# Patient Record
Sex: Female | Born: 1980 | Race: White | Hispanic: No | Smoking: Current every day smoker
Health system: Southern US, Community
[De-identification: ages and names within clinical notes are randomized; demographics above are authoritative.]

## PROBLEM LIST (undated history)

## (undated) DIAGNOSIS — J45909 Unspecified asthma, uncomplicated: Secondary | ICD-10-CM

## (undated) DIAGNOSIS — F329 Major depressive disorder, single episode, unspecified: Secondary | ICD-10-CM

## (undated) DIAGNOSIS — F419 Anxiety disorder, unspecified: Secondary | ICD-10-CM

## (undated) DIAGNOSIS — J40 Bronchitis, not specified as acute or chronic: Secondary | ICD-10-CM

## (undated) DIAGNOSIS — I10 Essential (primary) hypertension: Secondary | ICD-10-CM

## (undated) DIAGNOSIS — F32A Depression, unspecified: Secondary | ICD-10-CM

## (undated) DIAGNOSIS — F319 Bipolar disorder, unspecified: Secondary | ICD-10-CM

## (undated) HISTORY — PX: APPENDECTOMY: SHX54

## (undated) HISTORY — PX: WISDOM TOOTH EXTRACTION: SHX21

---
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
## 2006-08-30 ENCOUNTER — Ambulatory Visit (HOSPITAL_COMMUNITY): Admission: AD | Admit: 2006-08-30 | Discharge: 2006-08-30 | Payer: Self-pay | Admitting: Obstetrics and Gynecology

## 2006-09-06 ENCOUNTER — Inpatient Hospital Stay (HOSPITAL_COMMUNITY): Admission: AD | Admit: 2006-09-06 | Discharge: 2006-09-09 | Payer: Self-pay | Admitting: Obstetrics and Gynecology

## 2006-09-07 ENCOUNTER — Encounter (INDEPENDENT_AMBULATORY_CARE_PROVIDER_SITE_OTHER): Payer: Self-pay | Admitting: *Deleted

## 2007-03-17 ENCOUNTER — Ambulatory Visit (HOSPITAL_COMMUNITY): Admission: RE | Admit: 2007-03-17 | Discharge: 2007-03-17 | Payer: Self-pay | Admitting: Obstetrics & Gynecology

## 2007-04-07 ENCOUNTER — Other Ambulatory Visit: Admission: RE | Admit: 2007-04-07 | Discharge: 2007-04-07 | Payer: Self-pay | Admitting: Obstetrics and Gynecology

## 2007-05-24 ENCOUNTER — Ambulatory Visit (HOSPITAL_COMMUNITY): Admission: RE | Admit: 2007-05-24 | Discharge: 2007-05-24 | Payer: Self-pay | Admitting: Obstetrics & Gynecology

## 2007-08-27 ENCOUNTER — Emergency Department (HOSPITAL_COMMUNITY): Admission: EM | Admit: 2007-08-27 | Discharge: 2007-08-27 | Payer: Self-pay | Admitting: Emergency Medicine

## 2008-01-22 ENCOUNTER — Emergency Department (HOSPITAL_COMMUNITY): Admission: EM | Admit: 2008-01-22 | Discharge: 2008-01-22 | Payer: Self-pay | Admitting: Emergency Medicine

## 2008-03-23 ENCOUNTER — Emergency Department (HOSPITAL_COMMUNITY): Admission: EM | Admit: 2008-03-23 | Discharge: 2008-03-23 | Payer: Self-pay | Admitting: Emergency Medicine

## 2008-03-24 ENCOUNTER — Emergency Department (HOSPITAL_COMMUNITY): Admission: EM | Admit: 2008-03-24 | Discharge: 2008-03-24 | Payer: Self-pay | Admitting: Emergency Medicine

## 2008-06-01 ENCOUNTER — Other Ambulatory Visit: Admission: RE | Admit: 2008-06-01 | Discharge: 2008-06-01 | Payer: Self-pay | Admitting: Obstetrics and Gynecology

## 2008-07-29 ENCOUNTER — Emergency Department (HOSPITAL_COMMUNITY): Admission: EM | Admit: 2008-07-29 | Discharge: 2008-07-29 | Payer: Self-pay | Admitting: Emergency Medicine

## 2008-08-02 ENCOUNTER — Ambulatory Visit: Payer: Self-pay | Admitting: Orthopedic Surgery

## 2008-08-02 DIAGNOSIS — M79609 Pain in unspecified limb: Secondary | ICD-10-CM

## 2008-08-03 ENCOUNTER — Telehealth: Payer: Self-pay | Admitting: Orthopedic Surgery

## 2008-08-03 ENCOUNTER — Encounter: Payer: Self-pay | Admitting: Orthopedic Surgery

## 2008-08-16 ENCOUNTER — Ambulatory Visit: Payer: Self-pay | Admitting: Orthopedic Surgery

## 2008-08-30 ENCOUNTER — Ambulatory Visit: Payer: Self-pay | Admitting: Orthopedic Surgery

## 2008-09-06 ENCOUNTER — Ambulatory Visit: Payer: Self-pay | Admitting: Orthopedic Surgery

## 2008-09-27 ENCOUNTER — Ambulatory Visit: Payer: Self-pay | Admitting: Orthopedic Surgery

## 2008-11-17 ENCOUNTER — Inpatient Hospital Stay (HOSPITAL_COMMUNITY): Admission: EM | Admit: 2008-11-17 | Discharge: 2008-11-19 | Payer: Self-pay | Admitting: Emergency Medicine

## 2008-11-17 ENCOUNTER — Encounter (INDEPENDENT_AMBULATORY_CARE_PROVIDER_SITE_OTHER): Payer: Self-pay | Admitting: General Surgery

## 2008-12-07 ENCOUNTER — Emergency Department (HOSPITAL_COMMUNITY): Admission: EM | Admit: 2008-12-07 | Discharge: 2008-12-07 | Payer: Self-pay | Admitting: Emergency Medicine

## 2009-03-12 ENCOUNTER — Emergency Department (HOSPITAL_COMMUNITY): Admission: EM | Admit: 2009-03-12 | Discharge: 2009-03-12 | Payer: Self-pay | Admitting: Emergency Medicine

## 2009-03-29 ENCOUNTER — Emergency Department (HOSPITAL_COMMUNITY): Admission: EM | Admit: 2009-03-29 | Discharge: 2009-03-29 | Payer: Self-pay | Admitting: Emergency Medicine

## 2010-05-08 IMAGING — CT CT ABDOMEN W/ CM
2 of 4 series · 16 of 46 positions shown, 18 images · IV contrast (Omnipaque 300)
Comparison: None available.

CT ABDOMEN

CLINICAL DATA: Abdominal pain, nausea and vomiting.

CT ABDOMEN AND PELVIS WITH CONTRAST
TECHNIQUE: Multidetector CT imaging of the abdomen and pelvis was
performed using the standard protocol following bolus
administration of intravenous contrast.
Contrast: 100 ml 3mnipaque-RDD.

[Series 2: abd_pel_with 5.0 b40f · axial · 0.61mm/px · z∈[-439,-44]mm · 13 of 87 slices shown, 15 images]
[im 4/87  soft-tissue]
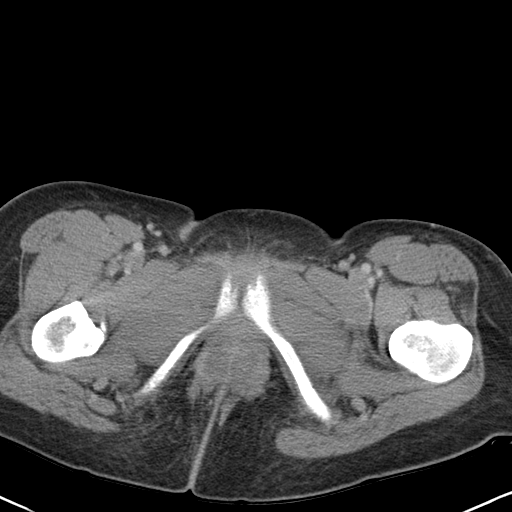
[im 4/87  bone]
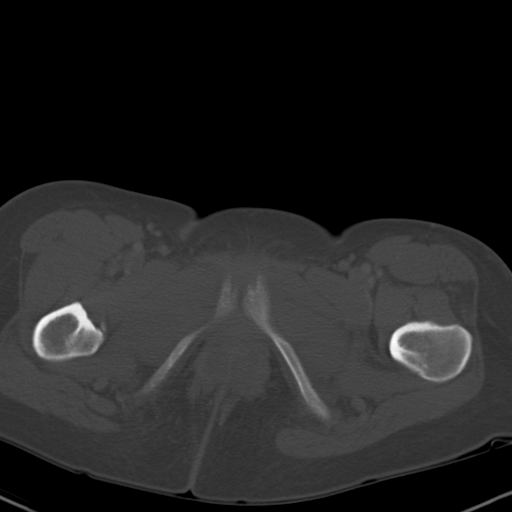
[im 11/87  soft-tissue]
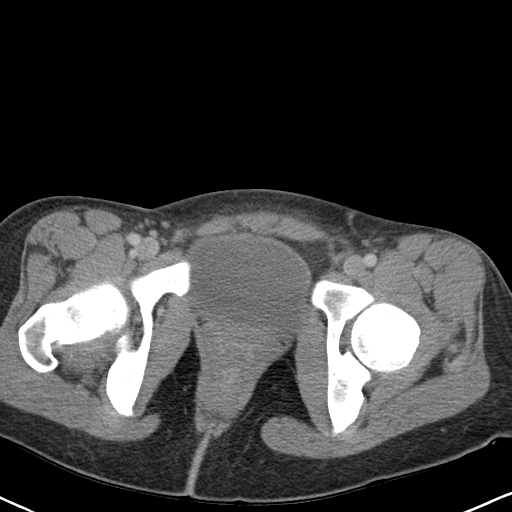
[im 18/87  soft-tissue]
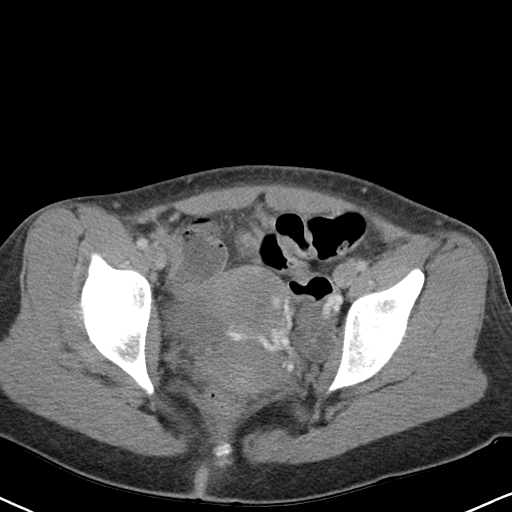
[im 25/87  soft-tissue]
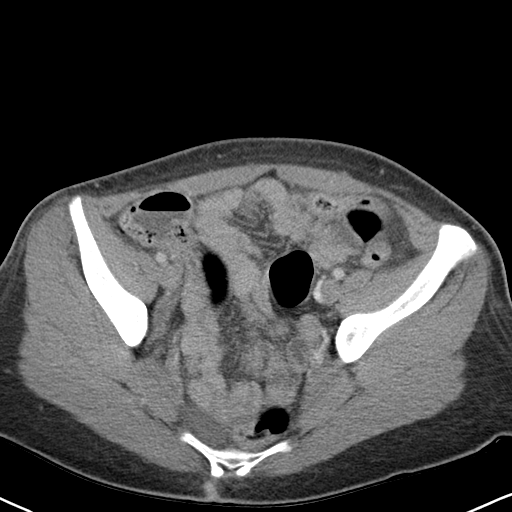
[im 31/87  soft-tissue]
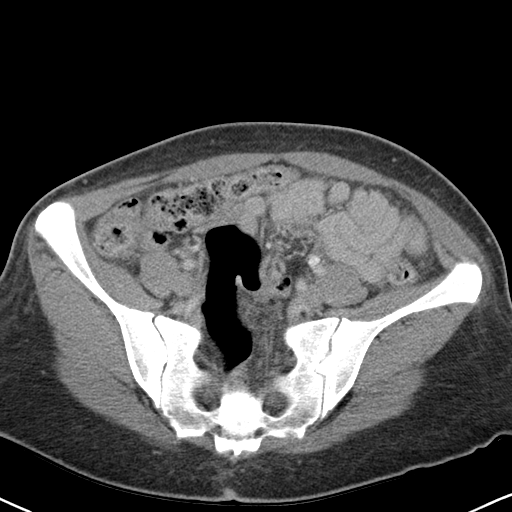
[im 38/87  soft-tissue]
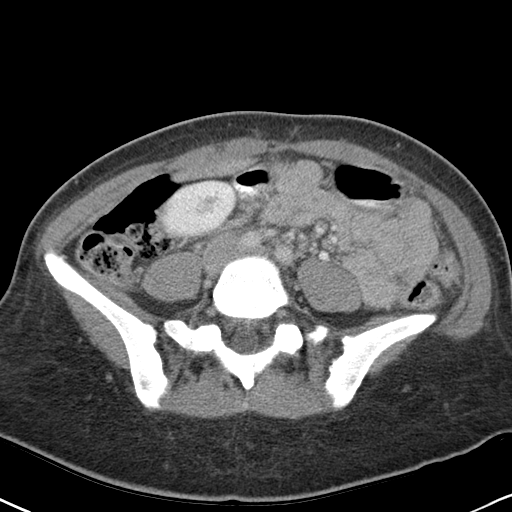
[im 45/87  soft-tissue]
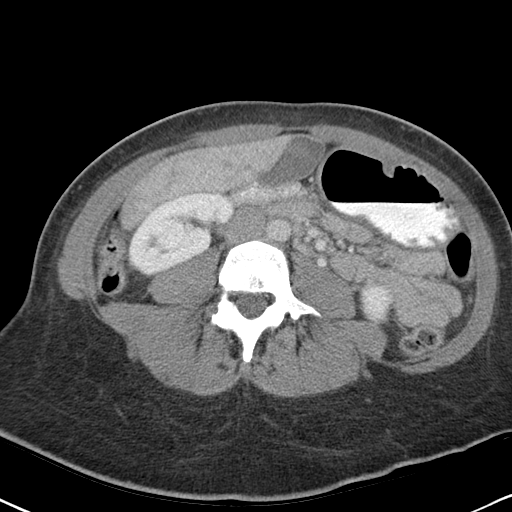
[im 49/87  soft-tissue]
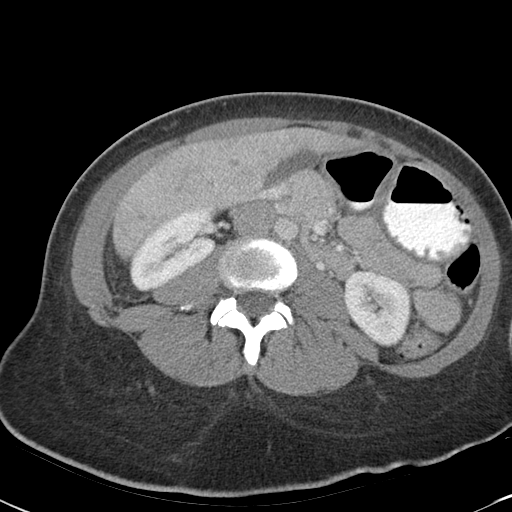
[im 56/87  soft-tissue]
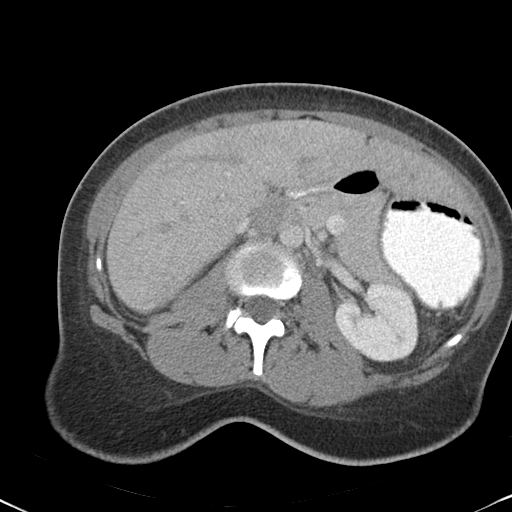
[im 56/87  bone]
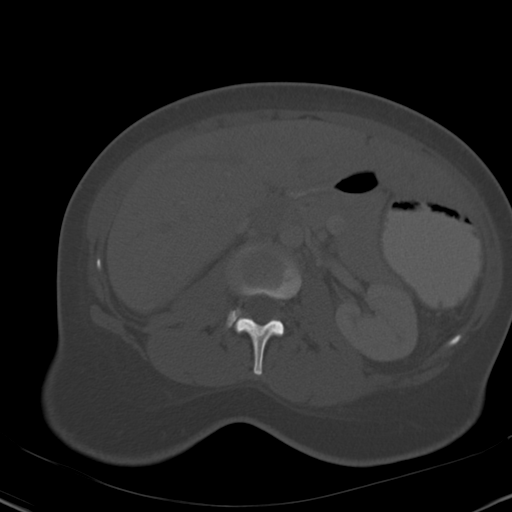
[im 62/87  soft-tissue]
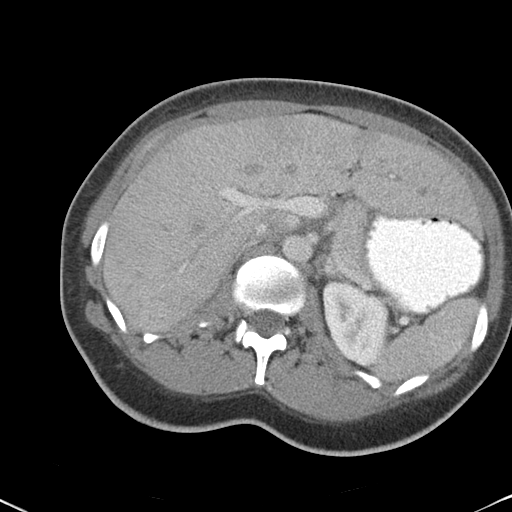
[im 69/87  soft-tissue]
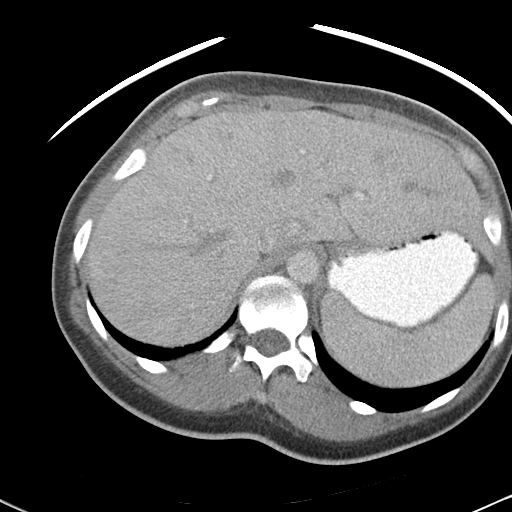
[im 76/87  soft-tissue]
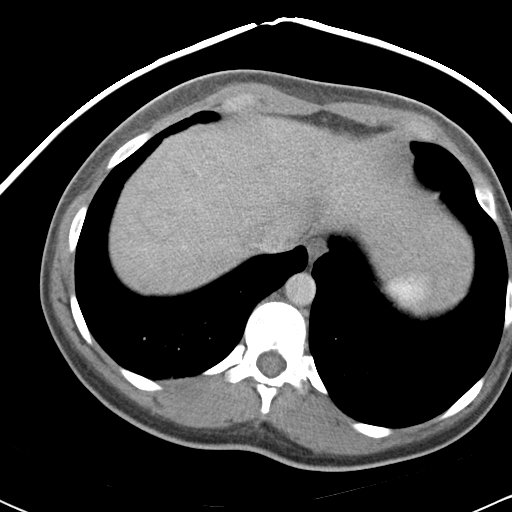
[im 83/87  soft-tissue]
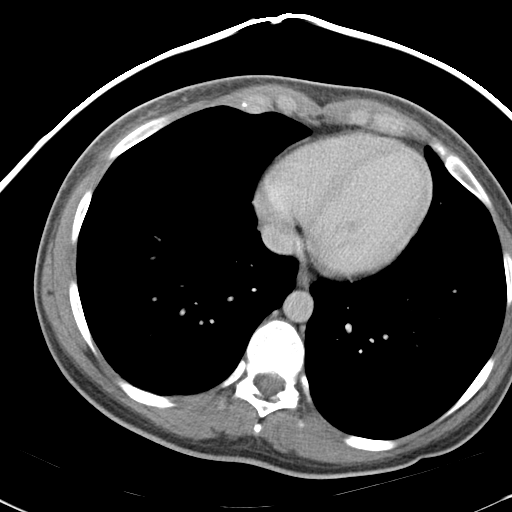

[Series 4: mpr cor post contrast (id) · coronal · 0.65mm/px · 3 of 73 slices shown]
[im 25/73  soft-tissue]
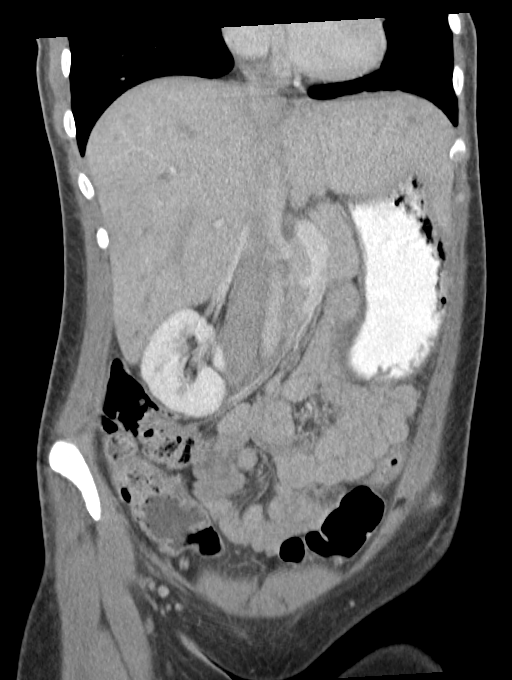
[im 33/73  soft-tissue]
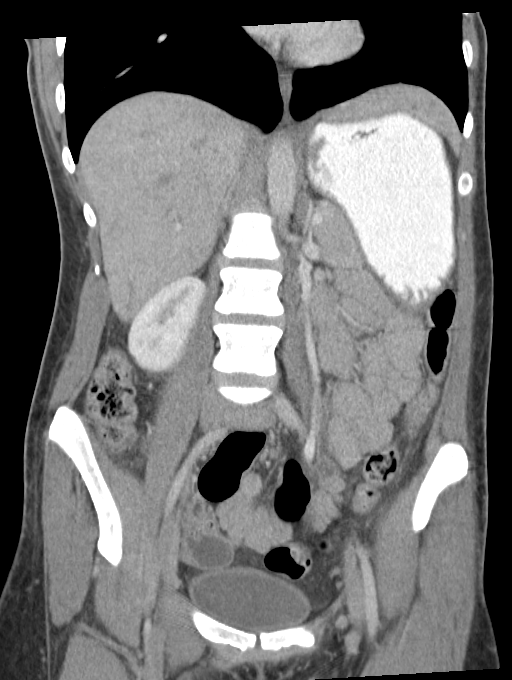
[im 41/73  soft-tissue]
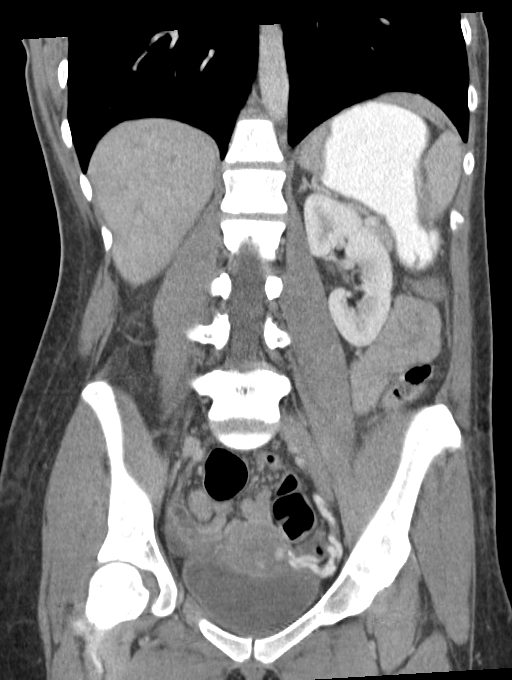

[16 of 46 positions shown; findings below may reference images not displayed]

FINDINGS: Lung bases are clear.  There is no pleural or
pericardial effusion.

The patient has subcutaneous and mesenteric edema.  There is a
hepatomegaly with the liver measuring nearly 20 cm cranial-caudal.
No focal liver lesion.  The gallbladder, adrenal glands, spleen and
pancreas all appear normal.  Stomach and small bowel are
unremarkable in appearance.  There is no abdominal lymphadenopathy
or fluid.  No focal bony abnormality.
IMPRESSION: 1.  Body wall and mesenteric edema compatible with volume overload.
2.  Hepatomegaly

CT PELVIS
FINDINGS: The appendix is dilated measuring 1.22 cm in diameter
with periappendiceal inflammation. No abscess or perforation.
There is a small volume of free pelvic fluid.  The colon, uterus
and adnexa are unremarkable.  No pelvic lymphadenopathy.  No focal
bony abnormality.
IMPRESSION: Study is positive for acute appendicitis without abscess or
perforation. Findings called to Dr. Anampa at the time of
interpretation.

## 2010-09-30 LAB — URINALYSIS, ROUTINE W REFLEX MICROSCOPIC
Bilirubin Urine: NEGATIVE
Hgb urine dipstick: NEGATIVE
Ketones, ur: NEGATIVE mg/dL
Protein, ur: NEGATIVE mg/dL

## 2010-10-01 LAB — DIFFERENTIAL
Basophils Absolute: 0 10*3/uL (ref 0.0–0.1)
Basophils Absolute: 0.1 10*3/uL (ref 0.0–0.1)
Basophils Relative: 0 % (ref 0–1)
Basophils Relative: 0 % (ref 0–1)
Basophils Relative: 1 % (ref 0–1)
Eosinophils Absolute: 0.2 10*3/uL (ref 0.0–0.7)
Eosinophils Relative: 2 % (ref 0–5)
Lymphocytes Relative: 14 % (ref 12–46)
Lymphs Abs: 2.5 10*3/uL (ref 0.7–4.0)
Lymphs Abs: 2.7 10*3/uL (ref 0.7–4.0)
Lymphs Abs: 4.1 10*3/uL — ABNORMAL HIGH (ref 0.7–4.0)
Monocytes Relative: 5 % (ref 3–12)
Neutro Abs: 11.3 10*3/uL — ABNORMAL HIGH (ref 1.7–7.7)
Neutro Abs: 14.4 10*3/uL — ABNORMAL HIGH (ref 1.7–7.7)
Neutrophils Relative %: 54 % (ref 43–77)
Neutrophils Relative %: 81 % — ABNORMAL HIGH (ref 43–77)

## 2010-10-01 LAB — COMPREHENSIVE METABOLIC PANEL
Albumin: 4.2 g/dL (ref 3.5–5.2)
Alkaline Phosphatase: 53 U/L (ref 39–117)
BUN: 7 mg/dL (ref 6–23)
CO2: 29 mEq/L (ref 19–32)
Chloride: 105 mEq/L (ref 96–112)
Creatinine, Ser: 0.8 mg/dL (ref 0.4–1.2)
GFR calc Af Amer: >60 mL/min (ref >60)
Glucose, Bld: 101 mg/dL — ABNORMAL HIGH (ref 70–99)
Total Bilirubin: 0.5 mg/dL (ref 0.3–1.2)

## 2010-10-01 LAB — CBC
HCT: 34.2 % — ABNORMAL LOW (ref 36.0–46.0)
HCT: 42.4 % (ref 36.0–46.0)
Hemoglobin: 14.9 g/dL (ref 12.0–15.0)
MCHC: 34.4 g/dL (ref 30.0–36.0)
MCHC: 34.9 g/dL (ref 30.0–36.0)
MCHC: 35 g/dL (ref 30.0–36.0)
MCV: 93.3 fL (ref 78.0–100.0)
Platelets: 174 10*3/uL (ref 150–400)
Platelets: 235 10*3/uL (ref 150–400)
Platelets: 243 10*3/uL (ref 150–400)
RBC: 4.79 MIL/uL (ref 3.87–5.11)
RDW: 13.3 % (ref 11.5–15.5)
WBC: 15.1 10*3/uL — ABNORMAL HIGH (ref 4.0–10.5)
WBC: 17.8 10*3/uL — ABNORMAL HIGH (ref 4.0–10.5)

## 2010-10-01 LAB — D-DIMER, QUANTITATIVE: D-Dimer, Quant: 0.22 ug/mL-FEU (ref 0.00–0.48)

## 2010-10-01 LAB — CARDIAC PANEL(CRET KIN+CKTOT+MB+TROPI)
CK, MB: 0.7 ng/mL (ref 0.3–4.0)
Relative Index: 0.7 (ref 0.0–2.5)
Total CK: 102 U/L (ref 7–177)
Troponin I: 0.01 ng/mL (ref 0.00–0.06)

## 2010-10-01 LAB — WET PREP, GENITAL: Yeast Wet Prep HPF POC: NONE SEEN

## 2010-10-01 LAB — URINALYSIS, ROUTINE W REFLEX MICROSCOPIC: Specific Gravity, Urine: 1.015 (ref 1.005–1.030)

## 2010-10-01 LAB — PREGNANCY, URINE: Preg Test, Ur: NEGATIVE

## 2010-10-01 LAB — LIPASE, BLOOD: Lipase: 29 U/L (ref 11–59)

## 2010-10-08 LAB — DIFFERENTIAL
Basophils Relative: 1 % (ref 0–1)
Eosinophils Absolute: 0.5 10*3/uL (ref 0.0–0.7)
Lymphs Abs: 2.8 10*3/uL (ref 0.7–4.0)
Neutro Abs: 4.6 10*3/uL (ref 1.7–7.7)
Neutrophils Relative %: 53 % (ref 43–77)

## 2010-10-08 LAB — CBC
MCV: 93.4 fL (ref 78.0–100.0)
Platelets: 210 10*3/uL (ref 150–400)
WBC: 8.7 10*3/uL (ref 4.0–10.5)

## 2010-10-08 LAB — BASIC METABOLIC PANEL
BUN: 8 mg/dL (ref 6–23)
Calcium: 9.1 mg/dL (ref 8.4–10.5)
Chloride: 107 mEq/L (ref 96–112)
Creatinine, Ser: 0.71 mg/dL (ref 0.4–1.2)
GFR calc Af Amer: >60 mL/min (ref >60)

## 2010-10-08 LAB — SEDIMENTATION RATE: Sed Rate: 2 mm/hr (ref 0–22)

## 2010-10-12 ENCOUNTER — Emergency Department (INDEPENDENT_AMBULATORY_CARE_PROVIDER_SITE_OTHER): Payer: Medicaid Other

## 2010-10-12 ENCOUNTER — Emergency Department (HOSPITAL_BASED_OUTPATIENT_CLINIC_OR_DEPARTMENT_OTHER)
Admission: EM | Admit: 2010-10-12 | Discharge: 2010-10-12 | Disposition: A | Discharge status: Home / Self Care | Payer: Medicaid Other | Attending: Emergency Medicine | Admitting: Emergency Medicine

## 2010-10-12 DIAGNOSIS — R05 Cough: Secondary | ICD-10-CM | POA: Insufficient documentation

## 2010-10-12 DIAGNOSIS — F17200 Nicotine dependence, unspecified, uncomplicated: Secondary | ICD-10-CM | POA: Insufficient documentation

## 2010-10-12 DIAGNOSIS — R51 Headache: Secondary | ICD-10-CM | POA: Insufficient documentation

## 2010-10-12 DIAGNOSIS — R0989 Other specified symptoms and signs involving the circulatory and respiratory systems: Secondary | ICD-10-CM

## 2010-10-12 DIAGNOSIS — Z79899 Other long term (current) drug therapy: Secondary | ICD-10-CM | POA: Insufficient documentation

## 2010-10-12 DIAGNOSIS — R059 Cough, unspecified: Secondary | ICD-10-CM | POA: Insufficient documentation

## 2010-10-12 DIAGNOSIS — R197 Diarrhea, unspecified: Secondary | ICD-10-CM | POA: Insufficient documentation

## 2010-10-12 LAB — URINALYSIS, ROUTINE W REFLEX MICROSCOPIC
Ketones, ur: NEGATIVE mg/dL
Nitrite: NEGATIVE
Protein, ur: NEGATIVE mg/dL

## 2010-11-05 NOTE — Op Note (Signed)
NAME:  Tracey Friedman, Tracey Friedman NO.:  [id number]   MEDICAL RECORD NO.:  [id number]          PATIENT TYPE:  INP   LOCATION:  A319                          FACILITY:  [facility]   PHYSICIAN:  Barbaraann Barthel, M.D. DATE OF BIRTH:  04/10/1981   DATE OF PROCEDURE:  11/17/2008  DATE OF DISCHARGE:                               OPERATIVE REPORT   SURGEON:  Barbaraann Barthel, MD   PREOPERATIVE DIAGNOSIS:  Acute appendicitis.   POSTOPERATIVE DIAGNOSIS:  Acute appendicitis.   PROCEDURE:  Open appendectomy.   WOUND CLASSIFICATION:  Contaminated.   SPECIMEN:  Appendix.   NOTE:  This is a 30 year old white female who had approximately 24-hour  history of abdominal pain localized to the right lower quadrant  accompanied with nausea and vomiting.  She had a CT scan which revealed  acute appendicitis.  This was confirmed intraoperatively with a  thickened enlarged appendix which was not perforated.  Also, found on CT  scan with some edema of her tissues.   GROSS OPERATIVE FINDINGS:  An acute nonperforated appendix, her tissues  appeared very rubbery and the subcu tissue very rubbery and lax and her  tissues were doughy, but I did not really appreciate any obvious edema  of the mesentery, etc.  The appendix was enlarged and not perforated.  The terminal ileum was normal.  There was some fluid in the pelvis which  was cleared and did not appear to be supprative in nature.   TECHNIQUE:  The patient was placed in supine position after the adequate  administration of general anesthesia via endotracheal intubation.  A  Foley catheter was aseptically inserted.  She was prepped with Betadine  solution and draped in the usual manner.  A small transverse incision  was carried out over the area of maximal tenderness in the right lower  quadrant through skin, subcutaneous tissue down to the rectus muscle  sheath which was opened, the rectus muscle was retracted medially.  The  posterior sheath was  grasped and opened and the abdomen was explored  with the above finding.  I placed the wound liner in the wound, we then  delivered appendix without any problem.  The patient had a rather long  mesoappendix, this was ligated with 2-0 silk.  The appendix was then  amputated with a TA-30 stapling device and oversewn with 3-0 GI silk.  We then changed gloves, irrigated with normal saline solution, checked  for hemostasis, and then closed the peritoneum with a running Polysorb  suture and the fascia with figure-of-eight Polysorb sutures.  The subcu  was irrigated.  I used approximately 15 mL or so of 0.5% Sensorcaine to  help with postoperative comfort and the skin was after irrigation with  normal saline was closed with stapling  device.  A sterile dressing was applied.  Prior to closure, all sponge,  needle, instrument counts found to be correct.  Estimated blood loss was  minimal.  The patient received 700 mL of crystalloids intraoperatively.  No drains were placed.  There were no complications.      Barbaraann Barthel, M.D.  Electronically  Signed     WB/MEDQ  D:  11/17/2008  T:  11/17/2008  Job:  [id number]

## 2010-11-05 NOTE — H&P (Signed)
NAME:  Tracey Friedman, Tracey Friedman NO.:  [id number]   MEDICAL RECORD NO.:  [id number]          PATIENT TYPE:  INP   LOCATION:  A319                          FACILITY:  [facility]   PHYSICIAN:  Barbaraann Barthel, M.D. DATE OF BIRTH:  Mar 14, 1981   DATE OF ADMISSION:  11/16/2008  DATE OF DISCHARGE:  LH                              HISTORY & PHYSICAL   NOTE:  Surgery was asked to see this 30 year old white female for  appendicitis.   Chief complaint is that of right lower quadrant pain, nausea, and  vomiting for approximately 24 hours.   HISTORY OF PRESENT MEDICAL ILLNESS:  The patient states that she began  having pain early in the morning on May 27.  She developed  periumbilical pain, later localized to the right lower quadrant, and she  experienced bouts of nausea and vomiting and anorexia.  She came to the  emergency room, where she was seen.  A CT scan was performed and  revealed acute appendicitis.  Surgery was consulted and responded  immediately.   PHYSICAL EXAMINATION:  As follows: this girl is a pleasant 30 year old  white female, who is uncomfortable, but in no acute distress.  She is 5  feet 4 inches, weighs 145 pounds.  Her blood pressure is 113/72, her  pulse is 69 per minute, respirations are 12 per minute, and her O2 sat  is 97%.   Head is normocephalic.  Eyes, extraocular movements are intact.  Pupils  were round and reactive to light and accommodation.  There is no  conjunctive pallor or scleral injection.  The sclerae is a normal  tincture.  The neck is supple and cylindrical without jugular vein  distention, thyromegaly, or tracheal deviation.  No bruits are  auscultated and there is no cervical adenopathy.  It should be noted  that she has her nose pierced as well as her tongue pierced.  Chest,  lungs are clear both anterior and posterior auscultation.  Heart,  regular rhythm.  Breasts and axilla are without masses.  Abdominal bowel  sounds are present.  The  patient has a kind of doughy abdomen with  tenderness in right lower quadrant with localized rebound there.  She  has no femoral or inguinal hernias appreciated.  Rectal  examination,  there is guaiac-negative stool.  The patient had a pelvic examination by  the emergency room physician and this was reported as normal.  Extremities: she has no pitting edema, no varicosities, no cyanosis, and  pulses are symmetrical.   REVIEW OF SYSTEMS:  Obstetrical and Gynecologic History:  She is a  gravida 3, para 1, abortus 2, cesarean 0 female, whose last menstrual  period was Nov 08, 2008.  She has no family history of breast carcinoma.  She is on birth control pills and she has a negative urine pregnancy  test.   GU System:  No history of dysuria or nephrolithiasis.  Endocrine System:  No history of diabetes or thyroid disease.  Musculoskeletal System:  Within normal limits.  Cardiorespiratory System:  The patient smokes a  pack of cigarettes per day, but  no past history of asthma or other  medical problems.  GI History:  History of nausea and vomiting and right  lower quadrant pain.  She has no past history of constipation, although  sometime she does states that she has hard stools.  No history of bright  red rectal bleeding.  No past history of inflammatory bowel disease or  irritable bowel  syndrome and no history of hepatitis.   PAST SURGICAL HISTORY:  D and C done in 1990 and this is the only  surgery she has had.   Family history of diabetes.  She herself has no history of diabetes.  She takes birth control pills as her only regular medication and she is  allergic to PENICILLIN.   LABORATORY DATA:  The patient has a white count of 15.1 with an H and H  of 15.5 and 45.1 with 75% neutrophilia.  Urinalysis is grossly within  normal limits.  There is no glucosuria or proteinuria noted.  No blood  in the urine.  Specific gravity is 1.015.  Her electrolytes are all  within normal limits.   Her potassium is 3.5 with a BUN of 7 and a  creatinine of 0.80.  Her liver function studies are all within normal  limits.  Lipase is not elevated.  CT scan showed acute appendicitis and  she was noted to have soft tissue edema which I do not appreciate  clinically other than the doughy abdomen and doughy subcu and as stated  she has no past history of liver disease and her liver functions are  normal and her kidney parameters appeared to be normal.  I have no  explanation for those findings on CT scan.   Impression, therefore, acute appendicitis of approximately 24 hours  duration.   PLAN:  She will be admitted.  Antibiotics have been started and she is  n.p.o. and we will plan for surgery.  We discussed surgery with her in  detail, discussing complications not limited to, but including bleeding,  infection, or leaking from appendiceal stump.  Informed consent was  obtained.       Barbaraann Barthel, M.D.  Electronically Signed     WB/MEDQ  D:  11/17/2008  T:  11/17/2008  Job:  [id number]

## 2010-11-05 NOTE — Discharge Summary (Signed)
NAME:  Tracey Friedman, BLAS NO.:  [id number]   MEDICAL RECORD NO.:  [id number]          PATIENT TYPE:  INP   LOCATION:  A319                          FACILITY:  [facility]   PHYSICIAN:  Barbaraann Barthel, M.D. DATE OF BIRTH:  Aug 26, 1980   DATE OF ADMISSION:  11/17/2008  DATE OF DISCHARGE:  05/31/2010LH                               DISCHARGE SUMMARY   DIAGNOSIS:  Acute appendicitis (final pathology pending).   PROCEDURE:  On Nov 18, 2008 is open appendectomy.   SECONDARY DIAGNOSIS:  Anxiety.   THIRD DIAGNOSIS:  The patient has some intraabdominal-appearing edema,  noted on CT scan, the etiology of this is unknown, no obvious hepatic or  renal cause.   Note, this is a 30 year old white female, who came into the emergency  room with signs and symptoms of acute appendicitis.  She was seen on Nov 17, 2008, and diagnosis of appendicitis was entertained by the emergency  room and confirmed on spine CT scan and intraoperatively.  The patient  underwent an uneventful open appendectomy.  The patient's hospital  course was completely uneventful.  Her diet and activity were advanced  as tolerated.  At time of discharge, she was tolerating a full liquid  diet.  She was passing gas.  She had no bowel movement, but her wound  was clean, and she had no distention, and she tolerated p.o. without any  nausea or any problems.  While in the hospital, she did have some  anxiety, which she suffers at home.  She is rather labile emotionally,  however, this was controlled with Ativan and with Xanax and she was seen  for this by the hospital staff.  We had rule out when she presented with  her anxiety.  We ruled out any cardiac etiology for chest pain and her D-  dimer was not elevated, and essentially she responded to the above-  mentioned anxiolytic therapy.  She was noted on CT scan to have some  unusual what appeared to be tissue edema, however, her liver function  studies were all normal,  and she did not have any elevation of her renal  parameters and the reason for this is not known.  During surgery, her  tissues were slightly rubbery, but otherwise normal.  She did have a  thickened nonperforated appendicitis at time of surgery and as stated  her final pathology is pending.  Postoperatively, she was discharged on  the third postoperative day.  Her white count had a downward trend.  Her  electrolytes were within normal limits and as stated, she was afebrile  and without any dysuria or any other medical problems at the time of  discharge.   DISCHARGE INSTRUCTIONS:  She is discharged on a full liquid diet.  She  is told to clean her wound with alcohol 3 times a day.  She is to call  me or go to the emergency room, should there be any acute problems.  Otherwise, I will see her on Thursday the November 23, 2008, at 2 o'clock  p.m. she is to take Ativan 0.5 mg every  8 hours for anxiety as needed,  Cipro 500 mg every 12 hours, the next dose being prescribed for 10  o'clock p.m. on  Nov 20, 2008, for the next 5 days, and she is to take Colace 100 mg  daily for her bowels, and Tylox every 6 hours as needed for pain.  We  will follow her perioperatively and as stated, she will contact us  should there be any acute changes.      Barbaraann Barthel, M.D.  Electronically Signed     WB/MEDQ  D:  11/19/2008  T:  11/20/2008  Job:  [id number]

## 2010-11-08 NOTE — H&P (Signed)
NAME:  Tracey Friedman, Tracey Friedman NO.:  [id number]   MEDICAL RECORD NO.:  [id number]          PATIENT TYPE:  INP   LOCATION:  LDR1                          FACILITY:  [facility]   PHYSICIAN:  Tilda Burrow, M.D. DATE OF BIRTH:  1981/03/18   DATE OF ADMISSION:  09/06/2006  DATE OF DISCHARGE:  LH                              HISTORY & PHYSICAL   ADDENDUM  TO HISTORY AND PHYSICAL WHICH WAS JUST DICTATED, [id number].   Please send copy to Dr. Jaquelyn Bitter. Halm DO      Tilda Burrow, M.D.  Electronically Signed     JVF/MEDQ  D:  09/06/2006  T:  09/06/2006  Job:  [id number]

## 2010-11-08 NOTE — Op Note (Signed)
NAME:  SARH, KIRSCHENBAUM NO.:  [id number]   MEDICAL RECORD NO.:  [id number]          PATIENT TYPE:  INP   LOCATION:  LDR1                          FACILITY:  [facility]   PHYSICIAN:  Tilda Burrow, M.D. DATE OF BIRTH:  03-Oct-1980   DATE OF PROCEDURE:  09/07/2006  DATE OF DISCHARGE:                               OPERATIVE REPORT   PROCEDURE PERFORMED:  Epidural catheter placement at 1 a.m. on March 17.   After patient request and consent was confirmed, continuous lumbar  epidural catheter placed at L2-3 interspace on first attempt at a depth  of approximately 6 cm after prepping and draping of the back with local  anesthesia wheal raised using 1% Xylocaine.  The Tuohy needle was easily  used to identify the epidural space, a 3 mL test dose of 1.5% Xylocaine  with epinephrine injected followed by insertion of the catheter 3 to 4  cm in to the epidural space.  The Tuohy needle removed.  The catheter  taped to the back and a 3 mL test dose injected through the catheter  confirming maternal pulse in the 90s and unchanged.  Area of the pump  was set up, primed and initial dosing of 7 mL test dose 14 mL per hour  initiated.  Patient had symmetric analgesic effect setting up and is  comfortable at time of this dictation.  Cervical exam shows the cervix 5  cm stretchable to 6, 50%, -2, _vertex, midposition cx .  Amniotomy  revealed slight meconium watery, nonparticulate amniotic fluid and so we  will begin Pitocin and proceed to our delivery.  Good prognosis for  vaginal delivery is considered.      Tilda Burrow, M.D.  Electronically Signed     JVF/MEDQ  D:  09/07/2006  T:  09/07/2006  Job:  [id number]

## 2010-11-08 NOTE — H&P (Signed)
NAME:  Tracey, Friedman NO.:  [id number]   MEDICAL RECORD NO.:  [id number]          PATIENT TYPE:  INP   LOCATION:  LDR1                          FACILITY:  [facility]   PHYSICIAN:  Tilda Burrow, M.D. DATE OF BIRTH:  Dec 31, 1980   DATE OF ADMISSION:  09/08/2006  DATE OF DISCHARGE:  LH                              HISTORY & PHYSICAL   CHIEF COMPLAINT:  Induction of labor secondary to post dates.   HISTORY OF PRESENT ILLNESS:  Tracey Friedman is a 30 year old, gravida 3, para 0,  AB 2, with an EDC of September 01, 2006 based on a 15-week ultrasound,  placing her at [redacted] weeks gestation.  She began prenatal care in her  second trimester and has had regular visits since then. Prenatal history  has been uneventful.  Blood type is O positive.  Rubella immune.  Hepatitis B surface antigen, HSV, RPR, gonorrhea, Chlamydia, and group B  strep are all negative.  One-hour GTT was normal at 100.  She has been  positive for marijuana most of her pregnancy.  However, she stated she  had last smoked it on June 29, 2006 and has had a subsequent negative  urine drug screen for marijuana.  Blood pressures have been 110s to  140s/60s to 80s.  Total weight gain has been about 40 pounds with  appropriate fundal height growth.  Interestingly Tracey Friedman used to weigh  300 pounds and she lost 150 of it just by walking.   PAST MEDICAL HISTORY:  Noncontributory.   PAST SURGICAL HISTORY:  Dilatation and curettage.   ALLERGIES:  PENICILLIN.   MEDICATIONS:  Prenatal vitamins.   SOCIAL HISTORY:  She is single.  Works at [employer].  Smokes half a pack a day  of cigarettes.   FAMILY HISTORY:  Dad has hepatitis C.  Grandmother has diabetes.  Positive for hypertension in her dad.   OB HISTORY:  Had two early miscarriages, one resulting in a dilatation  and curettage.   PHYSICAL EXAMINATION:  HEENT:  Within normal limits.  HEART:  Regular rate and rhythm.  LUNGS:  Clear.  ABDOMEN:  Soft, nontender.  Fundal height  is 40 cm.  CERVICAL:  1 cm, posterior, soft, and minus 2 station.  EXTREMITIES:  Trace edema.   IMPRESSION:  Intrauterine pregnancy at 41 weeks.  Medical induction of  labor secondary to post dates.   PLAN:  Admit for Foley preinduction on September 08, 2006 with AROM and  Pitocin in the morning.   Addendum: patient admitted 3/16 in early labor. jvferg      Jacklyn Shell, C.N.M.      Tilda Burrow, M.D.  Electronically Signed    FC/MEDQ  D:  09/01/2006  T:  09/03/2006  Job:  [id number]

## 2010-11-08 NOTE — H&P (Signed)
NAME:  Tracey Friedman, TATES NO.:  [id number]   MEDICAL RECORD NO.:  [id number]          PATIENT TYPE:  INP   LOCATION:  LDR1                          FACILITY:  [facility]   PHYSICIAN:  Tilda Burrow, M.D. DATE OF BIRTH:  09-Jun-1981   DATE OF ADMISSION:  09/06/2006  DATE OF DISCHARGE:  LH                              HISTORY & PHYSICAL   ADMITTING DIAGNOSES:  Pregnancy, 40-1/[redacted] weeks gestation, prodromal  labor.   HISTORY OF PRESENT ILLNESS:  This 30 year old female is admitted midday  on the 16th, after presenting to rule out labor.  We have spoken several  times over the weekend, due to labor symptoms.  Contractions are now q4-  6 minutes, non- _indentible___ .  Cervix is only slightly beginning to  change, with cervix now 1 cm, 50% in posterior, with vertex well applied  in the lower uterine segment.  Cervical tissue textures remain  relatively firm.  She has had generous amounts of mucus production but  is negative on Nitrazine.  Prenatal course is notable for positive UDS  for THC, which she used as self medication for nausea throughout the  first trimester.  Subsequent negative results, latent pregnancy.   MEDICAL HISTORY:  Benign surgical history, D&C in the past.   HABITS:  Cigarettes one half pack per day, alcohol denied, recreational  drugs THC first half of pregnancy.   SOCIAL HISTORY:  Single, works at [employer], lives alone.  Partner is Sabino Gasser.  This is his fourth child.  If she has a boy, plans to have him  circumcised.  Plans to breast feed with birth control pills as future  contraception.   FAMILY HISTORY:  Positive for father with Hepatitis C, and a grandparent  with diabetes.   PRENATAL LABS:  Include blood type O positive, urine drug screen  positive for THC, rubella immunity present, hemoglobin 12, hematocrit  36, hepatitis, HIV, RPR, GC and Chlamydia are negative.  HSV 2 negative,  group B strep negative, glucose tolerance is 100 mg percent.   Cervix 1  cm 50%, -2, with a posteriorly oriented cervix that is quite firm.   PLAN:  Foley bulb ripening of the cervix over the next 4 hours.  Will  reassess cervix at 5:00 p.m., given consideration to proceeding with  induction versus early a.m. induction.      Tilda Burrow, M.D.  Electronically Signed     JVF/MEDQ  D:  09/06/2006  T:  09/06/2006  Job:  [id number]   cc:   Francoise Schaumann. Milford Cage DO, FAAP  Fax: [phone]

## 2010-11-08 NOTE — Op Note (Signed)
NAME:  RUSSIA, SCHEIDERER NO.:  [id number]   MEDICAL RECORD NO.:  [id number]          PATIENT TYPE:  INP   LOCATION:  LDR1                          FACILITY:  [facility]   PHYSICIAN:  Tilda Burrow, M.D. DATE OF BIRTH:  07-24-1980   DATE OF PROCEDURE:  09/07/2006  DATE OF DISCHARGE:                                PROCEDURE NOTE   PROCEDURE:  Delivery summary.   ONSET OF LABOR:  September 07, 2006.   DATE OF DELIVERY:  September 07, 2006, at 9 a.m.   Length of first stage of labor 8 hours, length of second stage of labor  2 hours, length of third stage of labor 28 minutes.   DELIVERY NOTE:  Bettyjane had a normal spontaneous vaginal delivery of a  viable female infant.  Upon delivery of head, meconium was noted and the  infant was thoroughly suctioned with the DeLee catheter on perineum.  Approximately 2-3 mL of light meconium stained fluid obtained from the  upper airway.  There was spontaneous rotation and delivery of shoulders  without complications.  Cord was clamped.  The infant dried thoroughly  and suctioned again, to mother's abdomen for newborn's care.  Apgars  were 9 and 9.  Upon inspection, there is a left labial laceration and a  second degree perineal laceration.  10 mL of 2% lidocaine was  infiltrated per Epicath for repair.  Third stage of labor was actively  managed with 20 units of Pitocin and 1000 mL of D5 LR at rapid rate.  Placenta was delivered spontaneously via Tomasa Blase mechanism.  There was a  three-vessel cord noted.  Membranes were noted to be intact.  Placenta  was sent to pathology for evaluation.  Cord blood and cord blood gas was  obtained.  Left labial laceration was repaired with subcuticular closure  of 2-0 Vicryl and second degree laceration  was repaired with interrupted 2-0 Vicryl with subcutaneous to close.  Estimated blood loss was approximately 400 mL.  Infant and mother  stabilized and transferred out to postpartum unit in stable condition.  Epidural catheter was removed with blue tip intact.      Zerita Boers, Lanier Clam      Tilda Burrow, M.D.  Electronically Signed    DL/MEDQ  D:  56/21/3086  T:  09/07/2006  Job:  [id number]   cc:   Francoise Schaumann. Milford Cage DO, FAAP  Fax: [phone]

## 2012-06-17 ENCOUNTER — Encounter (HOSPITAL_BASED_OUTPATIENT_CLINIC_OR_DEPARTMENT_OTHER): Payer: Self-pay | Admitting: *Deleted

## 2012-06-17 DIAGNOSIS — L0100 Impetigo, unspecified: Secondary | ICD-10-CM | POA: Insufficient documentation

## 2012-06-17 DIAGNOSIS — F17200 Nicotine dependence, unspecified, uncomplicated: Secondary | ICD-10-CM | POA: Insufficient documentation

## 2012-06-17 DIAGNOSIS — Z79899 Other long term (current) drug therapy: Secondary | ICD-10-CM | POA: Insufficient documentation

## 2012-06-17 DIAGNOSIS — Z8709 Personal history of other diseases of the respiratory system: Secondary | ICD-10-CM | POA: Insufficient documentation

## 2012-06-17 DIAGNOSIS — I10 Essential (primary) hypertension: Secondary | ICD-10-CM | POA: Insufficient documentation

## 2012-06-17 DIAGNOSIS — L02419 Cutaneous abscess of limb, unspecified: Secondary | ICD-10-CM | POA: Insufficient documentation

## 2012-06-17 DIAGNOSIS — L03211 Cellulitis of face: Secondary | ICD-10-CM | POA: Insufficient documentation

## 2012-06-17 DIAGNOSIS — L299 Pruritus, unspecified: Secondary | ICD-10-CM | POA: Insufficient documentation

## 2012-06-17 DIAGNOSIS — J45909 Unspecified asthma, uncomplicated: Secondary | ICD-10-CM | POA: Insufficient documentation

## 2012-06-17 DIAGNOSIS — L0201 Cutaneous abscess of face: Secondary | ICD-10-CM | POA: Insufficient documentation

## 2012-06-17 DIAGNOSIS — L02519 Cutaneous abscess of unspecified hand: Secondary | ICD-10-CM | POA: Insufficient documentation

## 2012-06-17 DIAGNOSIS — L03119 Cellulitis of unspecified part of limb: Secondary | ICD-10-CM | POA: Insufficient documentation

## 2012-06-17 NOTE — ED Notes (Signed)
Pt reports she had a blister on her lip 1 week ago- blister popped and now has blisters on face, chin and hands- throat is swollen and tender per pt report

## 2012-06-18 ENCOUNTER — Emergency Department (HOSPITAL_BASED_OUTPATIENT_CLINIC_OR_DEPARTMENT_OTHER)
Admission: EM | Admit: 2012-06-18 | Discharge: 2012-06-18 | Disposition: A | Discharge status: Home / Self Care | Payer: Self-pay | Attending: Emergency Medicine | Admitting: Emergency Medicine

## 2012-06-18 DIAGNOSIS — L0390 Cellulitis, unspecified: Secondary | ICD-10-CM

## 2012-06-18 DIAGNOSIS — L0100 Impetigo, unspecified: Secondary | ICD-10-CM

## 2012-06-18 HISTORY — DX: Bronchitis, not specified as acute or chronic: J40

## 2012-06-18 HISTORY — DX: Essential (primary) hypertension: I10

## 2012-06-18 HISTORY — DX: Unspecified asthma, uncomplicated: J45.909

## 2012-06-18 MED ORDER — CLINDAMYCIN HCL 300 MG PO CAPS: 300.0000 mg | ORAL_CAPSULE | Freq: Four times a day (QID) | ORAL | Status: DC

## 2012-06-18 NOTE — ED Provider Notes (Signed)
History     CSN: [id number]  Arrival date & time 06/17/12  2037   First MD Initiated Contact with Patient 06/18/12 0008      Chief Complaint  Patient presents with  . Rash    (Consider location/radiation/quality/duration/timing/severity/associated sxs/prior treatment) Patient is a 31 y.o. female presenting with rash. The history is provided by the patient. No language interpreter was used.  Rash  This is a new problem. The current episode started more than 1 week ago. The problem has been gradually worsening. The problem is associated with nothing. There has been no fever. The rash is present on the face (hands legs). The pain is moderate. The pain has been constant since onset. Associated symptoms include blisters, itching and weeping. She has tried nothing for the symptoms. The treatment provided no relief.    Past Medical History  Diagnosis Date  . Asthma   . Bronchitis   . Hypertension     Past Surgical History  Procedure Date  . Appendectomy   . Wisdom tooth extraction     No family history on file.  History  Substance Use Topics  . Smoking status: Current Every Day Smoker    Types: Cigarettes  . Smokeless tobacco: Never Used  . Alcohol Use: No    OB History    Grav Para Term Preterm Abortions TAB SAB Ect Mult Living                  Review of Systems  Skin: Positive for itching and rash.  All other systems reviewed and are negative.    Allergies  Penicillins  Home Medications   Current Outpatient Rx  Name  Route  Sig  Dispense  Refill  . ALPRAZOLAM 2 MG PO TABS   Oral   Take 2 mg by mouth 3 (three) times daily as needed.           BP 114/52  Pulse 78  Temp 98.5 F (36.9 C) (Oral)  Resp 18  Ht 5\' 4"  (1.626 m)  Wt 165 lb (74.844 kg)  BMI 28.32 kg/m2  SpO2 98%  LMP 05/09/2012  Physical Exam  Constitutional: She is oriented to person, place, and time. She appears well-developed and well-nourished. No distress.  HENT:  Head:  Normocephalic and atraumatic.  Mouth/Throat: Oropharynx is clear and moist.  Eyes: Conjunctivae normal are normal. Pupils are equal, round, and reactive to light.  Neck: Normal range of motion. Neck supple. No tracheal deviation present.  Cardiovascular: Normal rate, regular rhythm and intact distal pulses.   Pulmonary/Chest: Effort normal. No stridor. She has no wheezes. She has no rales.  Abdominal: Soft. Bowel sounds are normal. There is no tenderness. There is no rebound and no guarding.  Musculoskeletal: Normal range of motion.  Lymphadenopathy:    She has no cervical adenopathy.  Neurological: She is alert and oriented to person, place, and time.  Skin: Skin is warm and dry. Rash noted.     Psychiatric: She has a normal mood and affect.    ED Course  Procedures (including critical care time)  Labs Reviewed - No data to display No results found.   No diagnosis found.    MDM  Impetigo on the face and community acquired MRSA with small cellulitis, no streaking recheck in 2 days given allergy will treat with clindamycin        Amrit Cress K Ruthetta Koopmann-Rasch, MD 06/18/12 [phone]

## 2012-12-09 ENCOUNTER — Ambulatory Visit (HOSPITAL_COMMUNITY)
Admission: RE | Admit: 2012-12-09 | Discharge: 2012-12-09 | Disposition: A | Discharge status: Home / Self Care | Payer: Medicaid Other | Source: Ambulatory Visit | Attending: Nurse Practitioner | Admitting: Nurse Practitioner

## 2012-12-09 ENCOUNTER — Encounter (HOSPITAL_COMMUNITY): Payer: Self-pay

## 2012-12-09 ENCOUNTER — Other Ambulatory Visit (HOSPITAL_COMMUNITY): Payer: Self-pay | Admitting: Nurse Practitioner

## 2012-12-09 DIAGNOSIS — O0970 Supervision of high risk pregnancy due to social problems, unspecified trimester: Secondary | ICD-10-CM

## 2012-12-09 DIAGNOSIS — Z363 Encounter for antenatal screening for malformations: Secondary | ICD-10-CM | POA: Insufficient documentation

## 2012-12-09 DIAGNOSIS — O09899 Supervision of other high risk pregnancies, unspecified trimester: Secondary | ICD-10-CM | POA: Insufficient documentation

## 2012-12-09 DIAGNOSIS — F1910 Other psychoactive substance abuse, uncomplicated: Secondary | ICD-10-CM | POA: Insufficient documentation

## 2012-12-09 DIAGNOSIS — Z1389 Encounter for screening for other disorder: Secondary | ICD-10-CM | POA: Insufficient documentation

## 2012-12-09 DIAGNOSIS — O3680X0 Pregnancy with inconclusive fetal viability, not applicable or unspecified: Secondary | ICD-10-CM

## 2012-12-09 DIAGNOSIS — O358XX0 Maternal care for other (suspected) fetal abnormality and damage, not applicable or unspecified: Secondary | ICD-10-CM | POA: Insufficient documentation

## 2012-12-09 DIAGNOSIS — O99340 Other mental disorders complicating pregnancy, unspecified trimester: Secondary | ICD-10-CM | POA: Insufficient documentation

## 2014-04-24 ENCOUNTER — Encounter (HOSPITAL_COMMUNITY): Payer: Self-pay

## 2015-12-08 ENCOUNTER — Emergency Department (HOSPITAL_COMMUNITY): Payer: Medicaid Other

## 2015-12-08 ENCOUNTER — Encounter (HOSPITAL_COMMUNITY): Payer: Self-pay | Admitting: Family Medicine

## 2015-12-08 ENCOUNTER — Emergency Department (HOSPITAL_COMMUNITY)
Admission: EM | Admit: 2015-12-08 | Discharge: 2015-12-08 | Disposition: A | Discharge status: Home / Self Care | Payer: Medicaid Other | Attending: Emergency Medicine | Admitting: Emergency Medicine

## 2015-12-08 DIAGNOSIS — L02414 Cutaneous abscess of left upper limb: Secondary | ICD-10-CM | POA: Diagnosis present

## 2015-12-08 DIAGNOSIS — I10 Essential (primary) hypertension: Secondary | ICD-10-CM | POA: Insufficient documentation

## 2015-12-08 DIAGNOSIS — F17210 Nicotine dependence, cigarettes, uncomplicated: Secondary | ICD-10-CM | POA: Insufficient documentation

## 2015-12-08 DIAGNOSIS — J45909 Unspecified asthma, uncomplicated: Secondary | ICD-10-CM | POA: Diagnosis not present

## 2015-12-08 DIAGNOSIS — L0291 Cutaneous abscess, unspecified: Secondary | ICD-10-CM

## 2015-12-08 DIAGNOSIS — L0390 Cellulitis, unspecified: Secondary | ICD-10-CM

## 2015-12-08 LAB — CBC WITH DIFFERENTIAL/PLATELET
BASOS ABS: 0 10*3/uL (ref 0.0–0.1)
Basophils Relative: 0 %
EOS ABS: 0.1 10*3/uL (ref 0.0–0.7)
EOS PCT: 2 %
HCT: 39.8 % (ref 36.0–46.0)
Hemoglobin: 13.1 g/dL (ref 12.0–15.0)
LYMPHS PCT: 37 %
Lymphs Abs: 2.9 10*3/uL (ref 0.7–4.0)
MCH: 29.4 pg (ref 26.0–34.0)
MCHC: 32.9 g/dL (ref 30.0–36.0)
MCV: 89.4 fL (ref 78.0–100.0)
MONO ABS: 0.6 10*3/uL (ref 0.1–1.0)
Monocytes Relative: 8 %
Neutro Abs: 4.1 10*3/uL (ref 1.7–7.7)
Neutrophils Relative %: 53 %
PLATELETS: 228 10*3/uL (ref 150–400)
RBC: 4.45 MIL/uL (ref 3.87–5.11)
RDW: 12.7 % (ref 11.5–15.5)
WBC: 7.8 10*3/uL (ref 4.0–10.5)

## 2015-12-08 LAB — COMPREHENSIVE METABOLIC PANEL
ALK PHOS: 60 U/L (ref 38–126)
ALT: 11 U/L — ABNORMAL LOW (ref 14–54)
ANION GAP: 6 (ref 5–15)
AST: 19 U/L (ref 15–41)
Albumin: 3.7 g/dL (ref 3.5–5.0)
BILIRUBIN TOTAL: 0.5 mg/dL (ref 0.3–1.2)
BUN: 7 mg/dL (ref 6–20)
CALCIUM: 9.1 mg/dL (ref 8.9–10.3)
CO2: 27 mmol/L (ref 22–32)
Chloride: 107 mmol/L (ref 101–111)
Creatinine, Ser: 0.84 mg/dL (ref 0.44–1.00)
GLUCOSE: 74 mg/dL (ref 65–99)
POTASSIUM: 3.8 mmol/L (ref 3.5–5.1)
Sodium: 140 mmol/L (ref 135–145)
TOTAL PROTEIN: 6.2 g/dL — AB (ref 6.5–8.1)

## 2015-12-08 LAB — I-STAT BETA HCG BLOOD, ED (MC, WL, AP ONLY)

## 2015-12-08 LAB — I-STAT CG4 LACTIC ACID, ED: LACTIC ACID, VENOUS: 0.95 mmol/L (ref 0.5–2.0)

## 2015-12-08 MED ORDER — CLINDAMYCIN HCL 150 MG PO CAPS
300.0000 mg | ORAL_CAPSULE | Freq: Four times a day (QID) | ORAL | Status: AC
Start: 2015-12-08 — End: ?

## 2015-12-08 NOTE — ED Notes (Addendum)
Pt here for abscess to left wrist/hand.sts started 3 weeks ago and opened and drained. Pt has open wound/hole in wrist and sts something is in it. Pt denies drug use

## 2015-12-08 NOTE — Discharge Instructions (Signed)

## 2015-12-08 NOTE — ED Provider Notes (Signed)
CSN: [id number]     Arrival date & time 12/08/15  1352 History   First MD Initiated Contact with Patient 12/08/15 1638     Chief Complaint  Patient presents with  . Abscess     (Consider location/radiation/quality/duration/timing/severity/associated sxs/prior Treatment) Patient is a 35 y.o. female presenting with abscess. The history is provided by the patient.  Abscess Location:  Hand Hand abscess location:  L wrist Size:  1 cm Abscess quality: draining, induration, redness and warmth   Abscess quality: no fluctuance   Red streaking: no   Duration:  3 weeks Progression:  Worsening Chronicity:  New Context: injected drug use   Relieved by:  Nothing Worsened by:  Nothing tried Ineffective treatments: pressure. Associated symptoms: no fever, no nausea and no vomiting   Risk factors: prior abscess     Past Medical History  Diagnosis Date  . Asthma   . Bronchitis   . Hypertension    Past Surgical History  Procedure Laterality Date  . Appendectomy    . Wisdom tooth extraction     History reviewed. No pertinent family history. Social History  Substance Use Topics  . Smoking status: Current Every Day Smoker    Types: Cigarettes  . Smokeless tobacco: Never Used  . Alcohol Use: No   OB History    Gravida Para Term Preterm AB TAB SAB Ectopic Multiple Living   1              Review of Systems  Constitutional: Negative for fever.  Gastrointestinal: Negative for nausea and vomiting.  All other systems reviewed and are negative.     Allergies  Penicillins  Home Medications   Prior to Admission medications   Medication Sig Start Date End Date Taking? Authorizing Provider  alprazolam Prudy Feeler) 2 MG tablet Take 2 mg by mouth 3 (three) times daily as needed.    Historical Provider, MD  clindamycin (CLEOCIN) 300 MG capsule Take 1 capsule (300 mg total) by mouth 4 (four) times daily. X 7 days 06/18/12   April Palumbo, MD   BP 99/77 mmHg  Pulse 110  Temp(Src) 98 F  (36.7 C)  Resp 18  SpO2 96%  LMP 11/05/2015  Breastfeeding? Unknown Physical Exam  Constitutional: She is oriented to person, place, and time. She appears well-developed and well-nourished. No distress.  HENT:  Head: Normocephalic.  Eyes: Conjunctivae are normal.  Neck: Neck supple. No tracheal deviation present.  Cardiovascular: Normal rate and regular rhythm.   Pulmonary/Chest: Effort normal. No respiratory distress.  Abdominal: Soft. She exhibits no distension.  Neurological: She is alert and oriented to person, place, and time.  Skin: Skin is warm and dry. There is erythema (with small central grey lesion from apparent ruptured abscess over left wrist, no fluctuance or drainable fluid collection noted).  Psychiatric: She has a normal mood and affect.      ED Course  Procedures (including critical care time) Labs Review Labs Reviewed  COMPREHENSIVE METABOLIC PANEL - Abnormal; Notable for the following:    Total Protein 6.2 (*)    ALT 11 (*)    All other components within normal limits  CBC WITH DIFFERENTIAL/PLATELET  I-STAT BETA HCG BLOOD, ED ([facility], [facility], [facility] ONLY)  I-STAT CG4 LACTIC ACID, ED  I-STAT CG4 LACTIC ACID, ED    Imaging Review Dg Wrist Complete Left  12/08/2015  CLINICAL DATA:  Abscess posterior left wrist ulnar side. Patient states it has been there for 1 week as she lanced it with a  razor yesterday. Wound is open. EXAM: LEFT WRIST - COMPLETE 3+ VIEW COMPARISON:  None. FINDINGS: Mild soft tissue prominence along the radial side the wrist with possible mild air within soft tissues likely representing patient's known fluid collection/open wound. Underlying bony structures are within normal. IMPRESSION: Soft tissue changes over the radial side of the wrists compatible with patient's known fluid collection/open wound in this region. Normal underlying bony structures. Electronically Signed   By: Elberta Fortisaniel  Boyle M.D.   On: 12/08/2015 16:21   I have personally reviewed and  evaluated these images and lab results as part of my medical decision-making.   EKG Interpretation None      MDM   Final diagnoses:  Abscess and cellulitis    35 y.o. female presents with left wrist lesion in setting of IVDU. States last injection was 1 month ago and she doesn't inject herself so she is unsure of injection site. No leg involvement or involvement of other limbs so doubt endocarditis and no systemic symptoms. Appears to be ruptured abscess with developing cellulitis around open wound. NVI distal to lesion. Will cover with PO ABx, clinda d/t unknown penicillin allergy, image added to EMR for tracking of involvement of tissue. Return precautions discussed for worsening or new concerning symptoms.     Lyndal Pulleyaniel Nimah Uphoff, MD 12/09/15 [phone]

## 2018-11-23 ENCOUNTER — Emergency Department (HOSPITAL_COMMUNITY)
Admission: EM | Admit: 2018-11-23 | Discharge: 2018-11-24 | Disposition: A | Discharge status: Home / Self Care | Payer: Medicaid Other | Attending: Emergency Medicine | Admitting: Emergency Medicine

## 2018-11-23 ENCOUNTER — Other Ambulatory Visit: Payer: Self-pay

## 2018-11-23 DIAGNOSIS — I10 Essential (primary) hypertension: Secondary | ICD-10-CM | POA: Insufficient documentation

## 2018-11-23 DIAGNOSIS — R51 Headache: Secondary | ICD-10-CM | POA: Diagnosis not present

## 2018-11-23 DIAGNOSIS — J45909 Unspecified asthma, uncomplicated: Secondary | ICD-10-CM | POA: Diagnosis not present

## 2018-11-23 DIAGNOSIS — T50901A Poisoning by unspecified drugs, medicaments and biological substances, accidental (unintentional), initial encounter: Secondary | ICD-10-CM

## 2018-11-23 DIAGNOSIS — F17210 Nicotine dependence, cigarettes, uncomplicated: Secondary | ICD-10-CM | POA: Diagnosis not present

## 2018-11-23 DIAGNOSIS — T50904A Poisoning by unspecified drugs, medicaments and biological substances, undetermined, initial encounter: Secondary | ICD-10-CM | POA: Diagnosis not present

## 2018-11-23 LAB — BASIC METABOLIC PANEL
Anion gap: 9 (ref 5–15)
BUN: 8 mg/dL (ref 6–20)
CO2: 25 mmol/L (ref 22–32)
Calcium: 8.7 mg/dL — ABNORMAL LOW (ref 8.9–10.3)
Chloride: 101 mmol/L (ref 98–111)
Creatinine, Ser: 0.86 mg/dL (ref 0.44–1.00)
GFR calc Af Amer: >60 mL/min (ref >60)
GFR calc non Af Amer: >60 mL/min (ref >60)
Glucose, Bld: 137 mg/dL — ABNORMAL HIGH (ref 70–99)
Potassium: 3.6 mmol/L (ref 3.5–5.1)
Sodium: 135 mmol/L (ref 135–145)

## 2018-11-23 MED ORDER — SODIUM CHLORIDE 0.9 % IV BOLUS
500.0000 mL | Freq: Once | INTRAVENOUS | Status: AC
  Administered 2018-11-23: 500 mL via INTRAVENOUS

## 2018-11-23 MED ORDER — ONDANSETRON HCL 4 MG/2ML IJ SOLN
4.0000 mg | Freq: Once | INTRAMUSCULAR | Status: AC
  Administered 2018-11-23: 4 mg via INTRAVENOUS
  Filled 2018-11-23: qty 2
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
## 2018-11-23 NOTE — ED Triage Notes (Signed)
Pt took heroin/possible fentanyl approx 45 mins and family reports pt overdosing. Received narcan pta. Is pale but arousable on arrival.

## 2018-11-24 LAB — CBC WITH DIFFERENTIAL/PLATELET
Abs Immature Granulocytes: 0.01 10*3/uL (ref 0.00–0.07)
Basophils Absolute: 0.1 10*3/uL (ref 0.0–0.1)
Basophils Relative: 1 %
Eosinophils Absolute: 0.1 10*3/uL (ref 0.0–0.5)
Eosinophils Relative: 1 %
HCT: 38.7 % (ref 36.0–46.0)
Hemoglobin: 13.2 g/dL (ref 12.0–15.0)
Immature Granulocytes: 0 %
Lymphocytes Relative: 32 %
Lymphs Abs: 1.9 10*3/uL (ref 0.7–4.0)
MCH: 30.9 pg (ref 26.0–34.0)
MCHC: 34.1 g/dL (ref 30.0–36.0)
MCV: 90.6 fL (ref 80.0–100.0)
Monocytes Absolute: 0.4 10*3/uL (ref 0.1–1.0)
Monocytes Relative: 6 %
Neutro Abs: 3.6 10*3/uL (ref 1.7–7.7)
Neutrophils Relative %: 60 %
Platelets: 235 10*3/uL (ref 150–400)
RBC: 4.27 MIL/uL (ref 3.87–5.11)
RDW: 12.3 % (ref 11.5–15.5)
WBC: 6 10*3/uL (ref 4.0–10.5)
nRBC: 0 % (ref 0.0–0.2)

## 2018-11-24 LAB — RAPID URINE DRUG SCREEN, HOSP PERFORMED
Amphetamines: POSITIVE — AB
Barbiturates: NOT DETECTED
Benzodiazepines: NOT DETECTED
Cocaine: NOT DETECTED
Opiates: NOT DETECTED
Tetrahydrocannabinol: POSITIVE — AB

## 2018-11-24 LAB — URINALYSIS, ROUTINE W REFLEX MICROSCOPIC
Bilirubin Urine: NEGATIVE
Glucose, UA: NEGATIVE mg/dL
Ketones, ur: NEGATIVE mg/dL
Nitrite: NEGATIVE
Protein, ur: 30 mg/dL — AB
Specific Gravity, Urine: 1.008 (ref 1.005–1.030)
pH: 7 (ref 5.0–8.0)
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
## 2018-11-24 NOTE — Discharge Instructions (Signed)
Refrain from using drugs, these are detrimental to your health. Follow-up with one of the outpatient centers-- see attached list of detox centers, shelters, etc. Return to the ED for new or worsening symptoms.

## 2018-12-30 ENCOUNTER — Other Ambulatory Visit: Payer: Self-pay

## 2018-12-30 ENCOUNTER — Emergency Department (HOSPITAL_COMMUNITY)
Admission: EM | Admit: 2018-12-30 | Discharge: 2018-12-30 | Disposition: A | Discharge status: Home / Self Care | Payer: Medicaid Other | Attending: Emergency Medicine | Admitting: Emergency Medicine

## 2018-12-30 ENCOUNTER — Encounter (HOSPITAL_COMMUNITY): Payer: Self-pay

## 2018-12-30 ENCOUNTER — Emergency Department (HOSPITAL_COMMUNITY): Payer: Medicaid Other

## 2018-12-30 DIAGNOSIS — F1910 Other psychoactive substance abuse, uncomplicated: Secondary | ICD-10-CM | POA: Diagnosis not present

## 2018-12-30 DIAGNOSIS — Z20828 Contact with and (suspected) exposure to other viral communicable diseases: Secondary | ICD-10-CM | POA: Insufficient documentation

## 2018-12-30 DIAGNOSIS — T401X1A Poisoning by heroin, accidental (unintentional), initial encounter: Secondary | ICD-10-CM | POA: Insufficient documentation

## 2018-12-30 DIAGNOSIS — J9601 Acute respiratory failure with hypoxia: Secondary | ICD-10-CM

## 2018-12-30 DIAGNOSIS — F319 Bipolar disorder, unspecified: Secondary | ICD-10-CM | POA: Insufficient documentation

## 2018-12-30 HISTORY — DX: Depression, unspecified: F32.A

## 2018-12-30 HISTORY — DX: Anxiety disorder, unspecified: F41.9

## 2018-12-30 HISTORY — DX: Bipolar disorder, unspecified: F31.9

## 2018-12-30 LAB — COMPREHENSIVE METABOLIC PANEL
ALT: 18 U/L (ref 0–44)
AST: 30 U/L (ref 15–41)
Albumin: 3.8 g/dL (ref 3.5–5.0)
Alkaline Phosphatase: 67 U/L (ref 38–126)
Anion gap: 11 (ref 5–15)
BUN: 6 mg/dL (ref 6–20)
CO2: 24 mmol/L (ref 22–32)
Calcium: 9.2 mg/dL (ref 8.9–10.3)
Chloride: 104 mmol/L (ref 98–111)
Creatinine, Ser: 1.07 mg/dL — ABNORMAL HIGH (ref 0.44–1.00)
GFR calc Af Amer: >60 mL/min (ref >60)
GFR calc non Af Amer: >60 mL/min (ref >60)
Glucose, Bld: 199 mg/dL — ABNORMAL HIGH (ref 70–99)
Potassium: 3.5 mmol/L (ref 3.5–5.1)
Sodium: 139 mmol/L (ref 135–145)
Total Bilirubin: 0.8 mg/dL (ref 0.3–1.2)
Total Protein: 6.9 g/dL (ref 6.5–8.1)

## 2018-12-30 LAB — ETHANOL: Alcohol, Ethyl (B): <10 mg/dL (ref <10)

## 2018-12-30 LAB — CBC WITH DIFFERENTIAL/PLATELET
Abs Immature Granulocytes: 0.04 10*3/uL (ref 0.00–0.07)
Basophils Absolute: 0.1 10*3/uL (ref 0.0–0.1)
Basophils Relative: 1 %
Eosinophils Absolute: 0.1 10*3/uL (ref 0.0–0.5)
Eosinophils Relative: 1 %
HCT: 44 % (ref 36.0–46.0)
Hemoglobin: 14.6 g/dL (ref 12.0–15.0)
Immature Granulocytes: 0 %
Lymphocytes Relative: 33 %
Lymphs Abs: 3.3 10*3/uL (ref 0.7–4.0)
MCH: 30.4 pg (ref 26.0–34.0)
MCHC: 33.2 g/dL (ref 30.0–36.0)
MCV: 91.5 fL (ref 80.0–100.0)
Monocytes Absolute: 0.5 10*3/uL (ref 0.1–1.0)
Monocytes Relative: 5 %
Neutro Abs: 6.1 10*3/uL (ref 1.7–7.7)
Neutrophils Relative %: 60 %
Platelets: 289 10*3/uL (ref 150–400)
RBC: 4.81 MIL/uL (ref 3.87–5.11)
RDW: 11.8 % (ref 11.5–15.5)
WBC: 10.1 10*3/uL (ref 4.0–10.5)
nRBC: 0 % (ref 0.0–0.2)

## 2018-12-30 LAB — URINALYSIS, ROUTINE W REFLEX MICROSCOPIC
Bilirubin Urine: NEGATIVE
Glucose, UA: NEGATIVE mg/dL
Hgb urine dipstick: NEGATIVE
Ketones, ur: NEGATIVE mg/dL
Nitrite: NEGATIVE
Protein, ur: 100 mg/dL — AB
Specific Gravity, Urine: 1.02 (ref 1.005–1.030)
WBC, UA: >50 WBC/hpf — ABNORMAL HIGH (ref 0–5)
pH: 6 (ref 5.0–8.0)

## 2018-12-30 LAB — SARS CORONAVIRUS 2 BY RT PCR (HOSPITAL ORDER, PERFORMED IN ~~LOC~~ HOSPITAL LAB): SARS Coronavirus 2: NEGATIVE

## 2018-12-30 LAB — RAPID URINE DRUG SCREEN, HOSP PERFORMED
Amphetamines: POSITIVE — AB
Barbiturates: NOT DETECTED
Benzodiazepines: POSITIVE — AB
Cocaine: POSITIVE — AB
Opiates: POSITIVE — AB
Tetrahydrocannabinol: POSITIVE — AB

## 2018-12-30 LAB — MAGNESIUM: Magnesium: 2.2 mg/dL (ref 1.7–2.4)

## 2018-12-30 LAB — TROPONIN I (HIGH SENSITIVITY): Troponin I (High Sensitivity): <2 ng/L (ref <18)

## 2018-12-30 MED ORDER — ONDANSETRON HCL 4 MG/2ML IJ SOLN
4.0000 mg | Freq: Once | INTRAMUSCULAR | Status: AC
  Administered 2018-12-30: 4 mg via INTRAVENOUS
  Filled 2018-12-30: qty 2

## 2018-12-30 MED ORDER — SODIUM CHLORIDE 0.9 % IV BOLUS
1000.0000 mL | Freq: Once | INTRAVENOUS | Status: AC
  Administered 2018-12-30: 1000 mL via INTRAVENOUS
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
## 2018-12-30 NOTE — ED Notes (Signed)
[employer] at bedside 

## 2018-12-30 NOTE — Discharge Instructions (Addendum)
You were seen in the emergency department for being unresponsive in the setting of using heroin.  Your symptoms improved with Narcan.  Will be important for you to stop using drugs and we are providing you with a list of resources.  Please return if any concerns.

## 2018-12-30 NOTE — ED Notes (Signed)
Pt given narcan 0.4 mg and 2 mg IV by ED staff.

## 2018-12-30 NOTE — ED Notes (Signed)
Pt awake & talking at this time.
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
## 2018-12-30 NOTE — ED Provider Notes (Addendum)
[facility] EMERGENCY DEPARTMENT Provider Note   CSN: [id number] Arrival date & time: 12/30/18  1419     History   Chief Complaint Chief Complaint  Patient presents with   Drug Overdose    HPI Tracey Friedman is a 38 y.o. female.     The history is provided by medical records and the patient (History from patient after she woke up.). The history is limited by the condition of the patient.  Altered Mental Status Presenting symptoms: unresponsiveness   Severity:  Severe Most recent episode:  Today Episode history:  Single Timing:  Constant Progression:  Unchanged Chronicity:  New Context: drug use   Associated symptoms: no fever     No past medical history on file.  There are no active problems to display for this patient.     OB History   No obstetric history on file.      Home Medications    Prior to Admission medications   Not on File    Family History No family history on file.  Social History Social History   Tobacco Use   Smoking status: Not on file  Substance Use Topics   Alcohol use: Not on file   Drug use: Not on file     Allergies   Patient has no allergy information on record.   Review of Systems Review of Systems  Unable to perform ROS: Patient unresponsive  Constitutional: Negative for fever.  All other systems reviewed and are negative.    Physical Exam Updated Vital Signs BP 126/87    Pulse (!) 105    Resp 17    LMP  (LMP Unknown)    SpO2 100%   Physical Exam Vitals signs and nursing note reviewed.  Constitutional:      General: She is in acute distress.     Appearance: She is well-developed.  HENT:     Head: Normocephalic and atraumatic.     Nose: No congestion or rhinorrhea.     Mouth/Throat:     Pharynx: No oropharyngeal exudate or posterior oropharyngeal erythema.  Eyes:     Conjunctiva/sclera: Conjunctivae normal.     Pupils: Pupils are equal, round, and reactive to light.  Neck:   Musculoskeletal: Neck supple.  Cardiovascular:     Rate and Rhythm: Regular rhythm. Tachycardia present.     Heart sounds: No murmur.  Pulmonary:     Effort: Respiratory distress present.     Breath sounds: Rhonchi present.  Abdominal:     General: Abdomen is flat.     Palpations: Abdomen is soft.     Tenderness: There is no abdominal tenderness. There is no right CVA tenderness or left CVA tenderness.  Musculoskeletal:        General: No tenderness.  Skin:    General: Skin is warm and dry.     Capillary Refill: Capillary refill takes less than 2 seconds.  Neurological:     Mental Status: She is unresponsive.     GCS: GCS eye subscore is 1. GCS verbal subscore is 1. GCS motor subscore is 1.     Comments: GCS 3 on arrival however after Narcan GCS was 14. Gradually improving as time goes on.      ED Treatments / Results  Labs (all labs ordered are listed, but only abnormal results are displayed) Labs Reviewed  COMPREHENSIVE METABOLIC PANEL - Abnormal; Notable for the following components:      Result Value   Glucose, Bld 199 (*)  Creatinine, Ser 1.07 (*)    All other components within normal limits  RAPID URINE DRUG SCREEN, HOSP PERFORMED - Abnormal; Notable for the following components:   Opiates POSITIVE (*)    Cocaine POSITIVE (*)    Benzodiazepines POSITIVE (*)    Amphetamines POSITIVE (*)    Tetrahydrocannabinol POSITIVE (*)    All other components within normal limits  SARS CORONAVIRUS 2 (HOSPITAL ORDER, PERFORMED IN [facility])  CBC WITH DIFFERENTIAL/PLATELET  TROPONIN I (HIGH SENSITIVITY)  ETHANOL  MAGNESIUM  TROPONIN I (HIGH SENSITIVITY)  URINALYSIS, ROUTINE W REFLEX MICROSCOPIC  POC URINE PREG, ED    EKG EKG Interpretation  Date/Time:  Thursday December 30 2018 14:44:47 EDT Ventricular Rate:  100 PR Interval:    QRS Duration: 92 QT Interval:  377 QTC Calculation: 487 R Axis:   24 Text Interpretation:  Sinus tachycardia Borderline T  abnormalities, inferior leads Borderline prolonged QT interval no prior to compare with Confirmed by Meridee ScoreButler, Michael [phone]([id number]) on 12/30/2018 3:11:52 PM   Radiology Dg Chest Portable 1 View  Result Date: 12/30/2018 CLINICAL DATA:  Hypoxia. EXAM: PORTABLE CHEST 1 VIEW COMPARISON:  None. FINDINGS: The heart size and mediastinal contours are within normal limits. Both lungs are clear. No pneumothorax or pleural effusion is noted. The visualized skeletal structures are unremarkable. IMPRESSION: No active disease. Electronically Signed   By: Lupita RaiderJames  Green Jr M.D.   On: 12/30/2018 15:06    Procedures Procedures (including critical care time)  CRITICAL CARE Performed by: Canary Brimhristopher J Ananya Mccleese Total critical care time: 35 minutes Critical care time was exclusive of separately billable procedures and treating other patients. Critical care was necessary to treat or prevent imminent or life-threatening deterioration. Critical care was time spent personally by me on the following activities: development of treatment plan with patient and/or surrogate as well as nursing, discussions with consultants, evaluation of patient's response to treatment, examination of patient, obtaining history from patient or surrogate, ordering and performing treatments and interventions, ordering and review of laboratory studies, ordering and review of radiographic studies, pulse oximetry and re-evaluation of patient's condition.  Tracey Friedman was evaluated in Emergency Department on 12/30/2018 for the symptoms described in the history of present illness. She was evaluated in the context of the global COVID-19 pandemic, which necessitated consideration that the patient might be at risk for infection with the SARS-CoV-2 virus that causes COVID-19. Institutional protocols and algorithms that pertain to the evaluation of patients at risk for COVID-19 are in a state of rapid change based on information released by regulatory bodies including  the CDC and federal and state organizations. These policies and algorithms were followed during the patient's care in the ED.   Medications Ordered in ED Medications  ondansetron (ZOFRAN) injection 4 mg (4 mg Intravenous Given 12/30/18 1436)  sodium chloride 0.9 % bolus 1,000 mL (1,000 mLs Intravenous New Bag/Given 12/30/18 1450)     Initial Impression / Assessment and Plan / ED Course  I have reviewed the triage vital signs and the nursing notes.  Pertinent labs & imaging results that were available during my care of the patient were reviewed by me and considered in my medical decision making (see chart for details).  Clinical Course as of Dec 31 1534  Thu Dec 30, 2018  1645 Patient is awake and alert.  She is asking to be discharged.  I offered to her that we could observe her for a while longer to make sure that her wound does not  last the Narcan but she is unwilling to stay.  She says she has a safe person to go with and they have Narcan available.  We will give her the list of substance abuse resources.   [MB]    Clinical Course User Index [MB] Terrilee FilesButler, Michael C, MD       Tracey Gayndrea J Tracey Friedman is a 38 y.o. female with a past medical history significant for anxiety, depression, bipolar disorder, and polysubstance abuse who presents unresponsive and respiratory failure.  Patient was brought in by unknown individual and dropped off and was found to be cyanotic, blue, and apneic.  Patient quickly started on bag valve mask breathing and brought to resuscitation room.  On my initial evaluation, patient is unresponsive with a GCS of 3.  Oxygen saturations were 100% with the bag-valve-mask and patient's heart rate was in the 90s.  Decision made to try 1 dose of Narcan as reports that patient may have been an overdose.  Patient was given Narcan and immediately started breathing and waking up.  Patient's oxygen saturations improved with nasal cannula and she will be monitored.  Patient reports  overdosing on heroin today.  On exam, no evidence of trauma on my initial exam.  Lungs had some coarse breath sounds bilaterally and abdomen and chest are nontender.  Patient moving all extremities.  Due to patient's hypoxia and respiratory failure from likely overdose, she will have screening laboratory testing performed, chest x-ray and will have coronavirus sent as well.  I reassessed patient and she is breathing much better.  She is now on room air and having normal oxygen saturations.  She still slightly tachycardic and getting the fluids for rehydration.  Patient reports he is going to quit using heroin and will go to a inpatient rehab facility tomorrow.  Patient is awaiting results of her labs.  Care transferred Dr. Charm BargesButler while awaiting for reassessment.  Anticipate patient will be stable for discharge home as her somnolence and respiratory distress has not returned after the single dose of Narcan.  Anticipate discharge home after work-up is completed.   Final Clinical Impressions(s) / ED Diagnoses   Final diagnoses:  Accidental overdose of heroin, initial encounter (HCC)  Acute respiratory failure with hypoxia [facility](HCC)    ED Discharge Orders    None      Clinical Impression: 1. Accidental overdose of heroin, initial encounter (HCC)   2. Acute respiratory failure with hypoxia (HCC)     Disposition: Admit  This note was prepared with assistance of Dragon voice recognition software. Occasional wrong-word or sound-a-like substitutions may have occurred due to the inherent limitations of voice recognition software.         Tonee Silverstein, Canary Brimhristopher J, MD 12/30/18 1611    Whitleigh Garramone, Canary Brimhristopher J, MD 12/31/18 1536

## 2018-12-30 NOTE — ED Provider Notes (Signed)
Signout from Dr. Sherry Ruffing.  38 year old brought in unresponsive and apneic.  Responded to Narcan. Physical Exam  BP (!) 132/99   Pulse (!) 104   Resp 20   LMP  (LMP Unknown)   SpO2 90%   Physical Exam  ED Course/Procedures   Clinical Course as of Dec 30 1000  Thu Dec 30, 2018  1645 Patient is awake and alert.  She is asking to be discharged.  I offered to her that we could observe her for a while longer to make sure that her wound does not last the Narcan but she is unwilling to stay.  She says she has a safe person to go with and they have Narcan available.  We will give her the list of substance abuse resources.   [MB]    Clinical Course User Index [MB] Hayden Rasmussen, MD    Procedures  MDM  Plan is to follow-up on labs and observe for period of time until she is able to be discharged or referred for rehab.       Hayden Rasmussen, MD 12/31/18 1002

## 2018-12-31 ENCOUNTER — Encounter (HOSPITAL_COMMUNITY): Payer: Self-pay | Admitting: Family Medicine

## 2019-08-22 DEATH — deceased
